# Patient Record
Sex: Male | Born: 1980 | Marital: Single | Smoking: Never smoker
Health system: Southern US, Community
[De-identification: ages and names within clinical notes are randomized; demographics above are authoritative.]

## PROBLEM LIST (undated history)

## (undated) DIAGNOSIS — R569 Unspecified convulsions: Secondary | ICD-10-CM

## (undated) DIAGNOSIS — B019 Varicella without complication: Secondary | ICD-10-CM

## (undated) DIAGNOSIS — Z5189 Encounter for other specified aftercare: Secondary | ICD-10-CM

## (undated) HISTORY — PX: OTHER SURGICAL HISTORY: SHX169

## (undated) HISTORY — DX: Unspecified convulsions: R56.9

## (undated) HISTORY — DX: Varicella without complication: B01.9

## (undated) HISTORY — DX: Encounter for other specified aftercare: Z51.89

---

## 2014-10-18 ENCOUNTER — Telehealth: Payer: Self-pay | Admitting: Family

## 2014-10-18 ENCOUNTER — Other Ambulatory Visit (INDEPENDENT_AMBULATORY_CARE_PROVIDER_SITE_OTHER): Payer: BC Managed Care – PPO

## 2014-10-18 ENCOUNTER — Ambulatory Visit (INDEPENDENT_AMBULATORY_CARE_PROVIDER_SITE_OTHER): Payer: BC Managed Care – PPO | Admitting: Family

## 2014-10-18 ENCOUNTER — Encounter: Payer: Self-pay | Admitting: Family

## 2014-10-18 VITALS — HR 82 | Temp 98.2°F | Resp 18 | Ht 70.0 in | Wt 196.4 lb

## 2014-10-18 DIAGNOSIS — Z0000 Encounter for general adult medical examination without abnormal findings: Secondary | ICD-10-CM

## 2014-10-18 DIAGNOSIS — L505 Cholinergic urticaria: Secondary | ICD-10-CM

## 2014-10-18 DIAGNOSIS — Z0189 Encounter for other specified special examinations: Secondary | ICD-10-CM

## 2014-10-18 DIAGNOSIS — Q255 Atresia of pulmonary artery: Secondary | ICD-10-CM

## 2014-10-18 LAB — COMPREHENSIVE METABOLIC PANEL
ALK PHOS: 51 U/L (ref 39–117)
ALT: 18 U/L (ref 0–53)
AST: 14 U/L (ref 0–37)
Albumin: 4.3 g/dL (ref 3.5–5.2)
BUN: 15 mg/dL (ref 6–23)
CHLORIDE: 104 meq/L (ref 96–112)
CO2: 30 mEq/L (ref 19–32)
Calcium: 9.6 mg/dL (ref 8.4–10.5)
Creatinine, Ser: 0.97 mg/dL (ref 0.40–1.50)
GFR: 94.65 mL/min (ref >60.00)
GLUCOSE: 90 mg/dL (ref 70–99)
POTASSIUM: 4.3 meq/L (ref 3.5–5.1)
SODIUM: 141 meq/L (ref 135–145)
Total Bilirubin: 0.6 mg/dL (ref 0.2–1.2)
Total Protein: 7.3 g/dL (ref 6.0–8.3)

## 2014-10-18 LAB — CBC WITH DIFFERENTIAL/PLATELET
Basophils Absolute: 0 10*3/uL (ref 0.0–0.1)
Basophils Relative: 0.3 % (ref 0.0–3.0)
EOS PCT: 1.5 % (ref 0.0–5.0)
Eosinophils Absolute: 0.1 10*3/uL (ref 0.0–0.7)
HCT: 47.4 % (ref 39.0–52.0)
Hemoglobin: 16.1 g/dL (ref 13.0–17.0)
LYMPHS ABS: 1.4 10*3/uL (ref 0.7–4.0)
Lymphocytes Relative: 20.2 % (ref 12.0–46.0)
MCHC: 33.9 g/dL (ref 30.0–36.0)
MCV: 84.8 fl (ref 78.0–100.0)
MONO ABS: 0.6 10*3/uL (ref 0.1–1.0)
MONOS PCT: 8.7 % (ref 3.0–12.0)
NEUTROS ABS: 4.9 10*3/uL (ref 1.4–7.7)
NEUTROS PCT: 69.3 % (ref 43.0–77.0)
PLATELETS: 231 10*3/uL (ref 150.0–400.0)
RBC: 5.59 Mil/uL (ref 4.22–5.81)
RDW: 13.2 % (ref 11.5–15.5)
WBC: 7.1 10*3/uL (ref 4.0–10.5)

## 2014-10-18 LAB — LIPID PANEL
CHOLESTEROL: 170 mg/dL (ref 0–200)
HDL: 41 mg/dL (ref >39.00)
LDL CALC: 107 mg/dL — AB (ref 0–99)
NONHDL: 129.06
Total CHOL/HDL Ratio: 4
Triglycerides: 108 mg/dL (ref 0.0–149.0)
VLDL: 21.6 mg/dL (ref 0.0–40.0)

## 2014-10-18 LAB — TSH: TSH: 0.98 u[IU]/mL (ref 0.35–4.50)

## 2014-10-18 MED ORDER — LORATADINE 10 MG PO TABS: 10.0000 mg | ORAL_TABLET | Freq: Every day | ORAL | Status: AC

## 2014-10-18 NOTE — Telephone Encounter (Signed)
Patient states that he would like a referral to a cardiologist and an allergist.

## 2014-10-18 NOTE — Telephone Encounter (Signed)
Referrals placed 

## 2014-10-18 NOTE — Assessment & Plan Note (Signed)
Not currently experiencing symptoms. Symptoms consistent with cholinergic urticaria. Start loratadine. Follow up if symptoms worsen or fail to improve with medication and possible referral to dermatology.

## 2014-10-18 NOTE — Telephone Encounter (Signed)
Please inform patient that his blood work is normal with no need for any changes or any cause of his symptoms.

## 2014-10-18 NOTE — Assessment & Plan Note (Signed)
Obtain TSH, lipid profile tablets, comprehensive metabolic panel, and CBC with differential for routine blood work.

## 2014-10-18 NOTE — Progress Notes (Signed)
Subjective:    Patient ID: John Valentine, male    DOB: 1980/01/29, 34 y.o.   MRN: [id number]  Chief Complaint  Patient presents with  . Establish Care    when his body heat hits a certain spike like when he walks outside and gets in the sun, it feels like a bunch of fire ants are crawling all over him, last from 5-25 mins at a time    HPI:  John FredricksonGabriel L Drewry is a 34 y.o. male who  has a past medical history of Blood transfusion without reported diagnosis; Seizures (HCC); and Chicken pox. and presents today for an office visit to establish.    1.) Heat rash - Associated symptom of feeling of burning and itching located sporadically around his trunk and described as fire ants crawling all over him and itching lasting about 5-25 minutes has been going on for several years and started when he moved to [place]. Modifying factors include stress adjustment and changing detergents. Aggravating factors include spike in body temperatures. Frequency ranges from several times per day or up to a couple of times per week. Denies any rash.   2.) Routine blood work - request routine blood work to check for abnormalities.   No Known Allergies   No outpatient prescriptions prior to visit.   No facility-administered medications prior to visit.     Past Medical History  Diagnosis Date  . Blood transfusion without reported diagnosis   . Seizures (HCC)   . Chicken pox      Past Surgical History  Procedure Laterality Date  . Open heart surgery      Pulmonary Atresia     Family History  Problem Relation Age of Onset  . Healthy Mother   . Healthy Father   . Healthy Maternal Grandmother   . Hepatitis Maternal Grandfather   . Healthy Paternal Grandmother   . Healthy Paternal Grandfather      Social History   Social History  . Marital Status: Single    Spouse Name: N/A  . Number of Children: 0  . Years of Education: 18   Occupational History  . Teacher     Social  History Main Topics  . Smoking status: Never Smoker   . Smokeless tobacco: Never Used  . Alcohol Use: 0.6 oz/week    0 Standard drinks or equivalent, 1 Glasses of wine per week  . Drug Use: No  . Sexual Activity: Not on file   Other Topics Concern  . Not on file   Social History Narrative   Fun: watch films, write, play video games            Cardiac surgeon:   Dr. Mateo FlowJoolher, [address], [place], [place] [id number]      Review of Systems  Constitutional: Negative for fever and chills.  Respiratory: Negative for chest tightness and shortness of breath.   Cardiovascular: Negative for chest pain, palpitations and leg swelling.  Endocrine: Negative for cold intolerance and heat intolerance.  Skin: Negative for rash and wound.      Objective:    Pulse 82  Temp(Src) 98.2 F (36.8 C) (Oral)  Resp 18  Ht 5\' 10"  (1.778 m)  Wt 196 lb 6.4 oz (89.086 kg)  BMI 28.18 kg/m2  SpO2 98% Nursing note and vital signs reviewed.  Physical Exam  Constitutional: He is oriented to person, place, and time. He appears well-developed and well-nourished. No distress.  Cardiovascular: Normal rate, regular rhythm, normal  heart sounds and intact distal pulses.   Pulmonary/Chest: Effort normal and breath sounds normal.  Neurological: He is alert and oriented to person, place, and time.  Skin: Skin is warm and dry. No rash noted.  Psychiatric: He has a normal mood and affect. His behavior is normal. Judgment and thought content normal.       Assessment & Plan:   Problem List Items Addressed This Visit      Musculoskeletal and Integument   Cholinergic urticaria - Primary    Not currently experiencing symptoms. Symptoms consistent with cholinergic urticaria. Start loratadine. Follow up if symptoms worsen or fail to improve with medication and possible referral to dermatology.      Relevant Medications   loratadine (CLARITIN) 10 MG tablet     Other   Encounter for routine laboratory  testing    Obtain TSH, lipid profile tablets, comprehensive metabolic panel, and CBC with differential for routine blood work.      Relevant Orders   Comprehensive metabolic panel (Completed)   Lipid panel (Completed)   TSH (Completed)   CBC w/Diff (Completed)

## 2014-10-18 NOTE — Progress Notes (Signed)
Pre visit review using our clinic review tool, if applicable. No additional management support is needed unless otherwise documented below in the visit note. 

## 2014-10-18 NOTE — Patient Instructions (Signed)
Thank you for choosing [facility].  Summary/Instructions:  Your prescription(s) have been submitted to your pharmacy or been printed and provided for you. Please take as directed and contact our office if you believe you are having problem(s) with the medication(s) or have any questions.  If your symptoms worsen or fail to improve, please contact our office for further instruction, or in case of emergency go directly to the emergency room at the closest medical facility.   Cholinergic urticara

## 2014-10-20 NOTE — Telephone Encounter (Signed)
LVM letting know.

## 2014-11-29 ENCOUNTER — Encounter: Payer: Self-pay | Admitting: Family

## 2015-05-24 ENCOUNTER — Other Ambulatory Visit: Payer: Self-pay | Admitting: Family Medicine

## 2015-05-24 ENCOUNTER — Ambulatory Visit
Admission: RE | Admit: 2015-05-24 | Discharge: 2015-05-24 | Disposition: A | Discharge status: Home / Self Care | Payer: Worker's Compensation | Source: Ambulatory Visit | Attending: Family Medicine | Admitting: Family Medicine

## 2015-05-24 DIAGNOSIS — J3489 Other specified disorders of nose and nasal sinuses: Secondary | ICD-10-CM | POA: Insufficient documentation

## 2015-05-24 DIAGNOSIS — Z026 Encounter for examination for insurance purposes: Secondary | ICD-10-CM | POA: Diagnosis not present

## 2015-05-24 DIAGNOSIS — S0993XA Unspecified injury of face, initial encounter: Secondary | ICD-10-CM | POA: Diagnosis present

## 2015-05-24 DIAGNOSIS — H578 Other specified disorders of eye and adnexa: Secondary | ICD-10-CM | POA: Insufficient documentation

## 2015-11-23 ENCOUNTER — Ambulatory Visit (INDEPENDENT_AMBULATORY_CARE_PROVIDER_SITE_OTHER): Payer: 59 | Admitting: Family Medicine

## 2015-11-23 ENCOUNTER — Encounter: Payer: Self-pay | Admitting: Family Medicine

## 2015-11-23 VITALS — BP 90/68 | HR 98 | Temp 98.3°F | Resp 16 | Ht 70.0 in | Wt 174.6 lb

## 2015-11-23 DIAGNOSIS — Z113 Encounter for screening for infections with a predominantly sexual mode of transmission: Secondary | ICD-10-CM

## 2015-11-23 DIAGNOSIS — Z0000 Encounter for general adult medical examination without abnormal findings: Secondary | ICD-10-CM

## 2015-11-23 DIAGNOSIS — Q220 Pulmonary valve atresia: Secondary | ICD-10-CM | POA: Insufficient documentation

## 2015-11-23 LAB — CBC
HCT: 47.9 % (ref 38.5–50.0)
Hemoglobin: 16.3 g/dL (ref 13.2–17.1)
MCH: 28.8 pg (ref 27.0–33.0)
MCHC: 34 g/dL (ref 32.0–36.0)
MCV: 84.8 fL (ref 80.0–100.0)
MPV: 10.8 fL (ref 7.5–12.5)
PLATELETS: 171 10*3/uL (ref 140–400)
RBC: 5.65 MIL/uL (ref 4.20–5.80)
RDW: 13.4 % (ref 11.0–15.0)
WBC: 5.6 10*3/uL (ref 3.8–10.8)

## 2015-11-23 LAB — BASIC METABOLIC PANEL
BUN: 16 mg/dL (ref 7–25)
CO2: 27 mmol/L (ref 20–31)
Calcium: 9.3 mg/dL (ref 8.6–10.3)
Chloride: 103 mmol/L (ref 98–110)
Creat: 0.92 mg/dL (ref 0.60–1.35)
Glucose, Bld: 83 mg/dL (ref 65–99)
POTASSIUM: 4.1 mmol/L (ref 3.5–5.3)
SODIUM: 138 mmol/L (ref 135–146)

## 2015-11-23 LAB — LIPID PANEL
CHOL/HDL RATIO: 4.6 ratio (ref <5.0)
Cholesterol: 204 mg/dL — ABNORMAL HIGH (ref <200)
HDL: 44 mg/dL (ref >40)
LDL Cholesterol: 137 mg/dL — ABNORMAL HIGH (ref <100)
Triglycerides: 116 mg/dL (ref <150)
VLDL: 23 mg/dL (ref <30)

## 2015-11-23 LAB — HIV ANTIBODY (ROUTINE TESTING W REFLEX): HIV: NONREACTIVE

## 2015-11-23 LAB — HEPATITIS B SURFACE ANTIBODY, QUANTITATIVE

## 2015-11-23 NOTE — Patient Instructions (Addendum)
   IF you received an x-ray today, you will receive an invoice from [facility]. Please contact [facility] at [phone] with questions or concerns regarding your invoice.   IF you received labwork today, you will receive an invoice from [facility]. Please contact [facility] at [phone] with questions or concerns regarding your invoice.   Our billing staff will not be able to assist you with questions regarding bills from these companies.  You will be contacted with the lab results as soon as they are available. The fastest way to get your results is to activate your My Chart account. Instructions are located on the last page of this paperwork. If you have not heard from us regarding the results in 2 weeks, please contact this office.     Health Maintenance, Male A healthy lifestyle and preventative care can promote health and wellness.  Maintain regular health, dental, and eye exams.  Eat a healthy diet. Foods like vegetables, fruits, whole grains, low-fat dairy products, and lean protein foods contain the nutrients you need and are low in calories. Decrease your intake of foods high in solid fats, added sugars, and salt. Get information about a proper diet from your health care provider, if necessary.  Regular physical exercise is one of the most important things you can do for your health. Most adults should get at least 150 minutes of moderate-intensity exercise (any activity that increases your heart rate and causes you to sweat) each week. In addition, most adults need muscle-strengthening exercises on 2 or more days a week.   Maintain a healthy weight. The body mass index (BMI) is a screening tool to identify possible weight problems. It provides an estimate of body fat based on height and weight. Your health care provider can find your BMI and can help you achieve or maintain a healthy weight. For males 20 years and older:  A BMI  below 18.5 is considered underweight.  A BMI of 18.5 to 24.9 is normal.  A BMI of 25 to 29.9 is considered overweight.  A BMI of 30 and above is considered obese.  Maintain normal blood lipids and cholesterol by exercising and minimizing your intake of saturated fat. Eat a balanced diet with plenty of fruits and vegetables. Blood tests for lipids and cholesterol should begin at age 20 and be repeated every 5 years. If your lipid or cholesterol levels are high, you are over age 50, or you are at high risk for heart disease, you may need your cholesterol levels checked more frequently.Ongoing high lipid and cholesterol levels should be treated with medicines if diet and exercise are not working.  If you smoke, find out from your health care provider how to quit. If you do not use tobacco, do not start.  Lung cancer screening is recommended for adults aged 55-80 years who are at high risk for developing lung cancer because of a history of smoking. A yearly low-dose CT scan of the lungs is recommended for people who have at least a 30-pack-year history of smoking and are current smokers or have quit within the past 15 years. A pack year of smoking is smoking an average of 1 pack of cigarettes a day for 1 year (for example, a 30-pack-year history of smoking could mean smoking 1 pack a day for 30 years or 2 packs a day for 15 years). Yearly screening should continue until the smoker has stopped smoking for at least 15 years. Yearly screening should be   stopped for people who develop a health problem that would prevent them from having lung cancer treatment.  If you choose to drink alcohol, do not have more than 2 drinks per day. One drink is considered to be 12 oz (360 mL) of beer, 5 oz (150 mL) of wine, or 1.5 oz (45 mL) of liquor.  Avoid the use of street drugs. Do not share needles with anyone. Ask for help if you need support or instructions about stopping the use of drugs.  High blood pressure  causes heart disease and increases the risk of stroke. High blood pressure is more likely to develop in:  People who have blood pressure in the end of the normal range (100-139/85-89 mm Hg).  People who are overweight or obese.  People who are African American.  If you are 18-39 years of age, have your blood pressure checked every 3-5 years. If you are 40 years of age or older, have your blood pressure checked every year. You should have your blood pressure measured twice-once when you are at a hospital or clinic, and once when you are not at a hospital or clinic. Record the average of the two measurements. To check your blood pressure when you are not at a hospital or clinic, you can use:  An automated blood pressure machine at a pharmacy.  A home blood pressure monitor.  If you are 45-79 years old, ask your health care provider if you should take aspirin to prevent heart disease.  Diabetes screening involves taking a blood sample to check your fasting blood sugar level. This should be done once every 3 years after age 45 if you are at a normal weight and without risk factors for diabetes. Testing should be considered at a younger age or be carried out more frequently if you are overweight and have at least 1 risk factor for diabetes.  Colorectal cancer can be detected and often prevented. Most routine colorectal cancer screening begins at the age of 50 and continues through age 75. However, your health care provider may recommend screening at an earlier age if you have risk factors for colon cancer. On a yearly basis, your health care provider may provide home test kits to check for hidden blood in the stool. A small camera at the end of a tube may be used to directly examine the colon (sigmoidoscopy or colonoscopy) to detect the earliest forms of colorectal cancer. Talk to your health care provider about this at age 50 when routine screening begins. A direct exam of the colon should be repeated  every 5-10 years through age 75, unless early forms of precancerous polyps or small growths are found.  People who are at an increased risk for hepatitis B should be screened for this virus. You are considered at high risk for hepatitis B if:  You were born in a country where hepatitis B occurs often. Talk with your health care provider about which countries are considered high risk.  Your parents were born in a high-risk country and you have not received a shot to protect against hepatitis B (hepatitis B vaccine).  You have HIV or AIDS.  You use needles to inject street drugs.  You live with, or have sex with, someone who has hepatitis B.  You are a man who has sex with other men (MSM).  You get hemodialysis treatment.  You take certain medicines for conditions like cancer, organ transplantation, and autoimmune conditions.  Hepatitis C blood testing is recommended   for all people born from 1945 through 1965 and any individual with known risk factors for hepatitis C.  Healthy men should no longer receive prostate-specific antigen (PSA) blood tests as part of routine cancer screening. Talk to your health care provider about prostate cancer screening.  Testicular cancer screening is not recommended for adolescents or adult males who have no symptoms. Screening includes self-exam, a health care provider exam, and other screening tests. Consult with your health care provider about any symptoms you have or any concerns you have about testicular cancer.  Practice safe sex. Use condoms and avoid high-risk sexual practices to reduce the spread of sexually transmitted infections (STIs).  You should be screened for STIs, including gonorrhea and chlamydia if:  You are sexually active and are younger than 24 years.  You are older than 24 years, and your health care provider tells you that you are at risk for this type of infection.  Your sexual activity has changed since you were last screened,  and you are at an increased risk for chlamydia or gonorrhea. Ask your health care provider if you are at risk.  If you are at risk of being infected with HIV, it is recommended that you take a prescription medicine daily to prevent HIV infection. This is called pre-exposure prophylaxis (PrEP). You are considered at risk if:  You are a man who has sex with other men (MSM).  You are a heterosexual man who is sexually active with multiple partners.  You take drugs by injection.  You are sexually active with a partner who has HIV.  Talk with your health care provider about whether you are at high risk of being infected with HIV. If you choose to begin PrEP, you should first be tested for HIV. You should then be tested every 3 months for as long as you are taking PrEP.  Use sunscreen. Apply sunscreen liberally and repeatedly throughout the day. You should seek shade when your shadow is shorter than you. Protect yourself by wearing long sleeves, pants, a wide-brimmed hat, and sunglasses year round whenever you are outdoors.  Tell your health care provider of new moles or changes in moles, especially if there is a change in shape or color. Also, tell your health care provider if a mole is larger than the size of a pencil eraser.  A one-time screening for abdominal aortic aneurysm (AAA) and surgical repair of large AAAs by ultrasound is recommended for men aged 65-75 years who are current or former smokers.  Stay current with your vaccines (immunizations). This information is not intended to replace advice given to you by your health care provider. Make sure you discuss any questions you have with your health care provider. Document Released: 06/16/2007 Document Revised: 01/08/2014 Document Reviewed: 09/21/2014 [employer]vier Interactive Patient Education  2017 [employer]  

## 2015-11-23 NOTE — Progress Notes (Signed)
Chief Complaint  Patient presents with  . Annual Exam    Subjective:  John Valentine is a 35 y.o. male here for a health maintenance visit.  Patient is established pt  Patient Active Problem List   Diagnosis Date Noted  . 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 Encounter for routine laboratory testing 10/18/2014    Past Medical History:  Diagnosis Date  . Blood transfusion without reported diagnosis   . Chicken pox   . Seizures (HCC)     Past Surgical History:  Procedure Laterality Date  . open heart surgery     Pulmonary Atresia     Outpatient Medications Prior to Visit  Medication Sig Dispense Refill  . loratadine (CLARITIN) 10 MG tablet Take 1 tablet (10 mg total) by mouth daily. (Patient not taking: Reported on 11/23/2015) 90 tablet 1   No facility-administered medications prior to visit.     No Known Allergies   Family History  Problem Relation Age of Onset  . Healthy Mother   . Healthy Father   . Healthy Maternal Grandmother   . Hepatitis Maternal Grandfather   . Healthy Paternal Grandmother   . Healthy Paternal Grandfather      Health Habits: Dental Exam: up to date Eye Exam: up to date Exercise: 4 times/week on average Current exercise activities: walking/running Diet: balanced diet  Social History   Social History  . Marital status: Single    Spouse name: N/A  . Number of children: 0  . Years of education: 78   Occupational History  . Teacher     Social History Main Topics  . Smoking status: Never Smoker  . Smokeless tobacco: Never Used  . Alcohol use 0.6 oz/week    1 Glasses of wine per week  . Drug use: No  . Sexual activity: Not on file   Other Topics Concern  . Not on file   Social History Narrative   Fun: watch films, write, play video games            Cardiac surgeon:   Dr. Mateo Flow, [address], [place], [place] [address]   History  Alcohol Use  . 0.6 oz/week  . 1 Glasses of  wine per week   History  Smoking Status  . Never Smoker  Smokeless Tobacco  . Never Used   History  Drug Use No    GYN: Sexual Health Sexually active:  with male partner   Health Maintenance: See under health Maintenance activity for review of completion dates as well. Immunization History  Administered Date(s) Administered  . Influenza-Unspecified 10/02/2015      Depression Screen-PHQ2/9 Depression screen PHQ 2/9 11/23/2015  Decreased Interest 0  Down, Depressed, Hopeless 0  PHQ - 2 Score 0      Depression Severity and Treatment Recommendations:  0-4= None  5-9= Mild / Treatment: Support, educate to call if worse; return in one month  10-14= Moderate / Treatment: Support, watchful waiting; Antidepressant or Psycotherapy  15-19= Moderately severe / Treatment: Antidepressant OR Psychotherapy  >= 20 = Major depression, severe / Antidepressant AND Psychotherapy    Review of Systems   Review of Systems  Constitutional: Negative for chills and fever.  HENT: Negative for hearing loss and tinnitus.   Eyes: Negative for blurred vision and double vision.  Respiratory: Negative for cough and hemoptysis.   Cardiovascular: Negative for chest pain, palpitations and orthopnea.  Gastrointestinal: Negative for abdominal pain, diarrhea, nausea and vomiting.  Genitourinary: Negative for dysuria, frequency and urgency.  Musculoskeletal: Negative for back pain and neck pain.  Skin: Negative for itching and rash.  Neurological: Negative for dizziness, tingling and headaches.  Psychiatric/Behavioral: Negative for depression. The patient does not have insomnia.     See HPI for ROS as well.    Objective:   Vitals:   11/23/15 0933  BP: 90/68  Pulse: 98  Resp: 16  Temp: 98.3 F (36.8 C)  TempSrc: Oral  SpO2: 97%  Weight: 174 lb 9.6 oz (79.2 kg)  Height: 5\' 10"  (1.778 m)    Body mass index is 25.05 kg/m.  Physical Exam  Constitutional: He is oriented to person,  place, and time. He appears well-developed and well-nourished.  HENT:  Head: Normocephalic and atraumatic.  Eyes: Conjunctivae and EOM are normal.  Neck: Normal range of motion. Neck supple. No tracheal deviation present. No thyromegaly present.  Cardiovascular: Normal rate, regular rhythm and normal heart sounds.   Pulmonary/Chest: Effort normal and breath sounds normal. No respiratory distress. He has no wheezes.  Abdominal: Soft. Bowel sounds are normal. He exhibits no distension. There is no tenderness. There is no rebound.  Musculoskeletal: Normal range of motion. He exhibits no edema.  Neurological: He is alert and oriented to person, place, and time. He has normal reflexes. No cranial nerve deficit.  Skin: Skin is warm. No erythema.  Psychiatric: He has a normal mood and affect. His behavior is normal. Judgment and thought content normal.       Assessment/Plan:   Patient was seen for a health maintenance exam.  Counseled the patient on health maintenance issues. Reviewed her health mainteance schedule and ordered appropriate tests (see orders.) Counseled on regular exercise and weight management. Recommend regular eye exams and dental cleaning.   The following issues were addressed today for health maintenance:   John Valentine was seen today for annual exam.  Diagnoses and all orders for this visit:  Health maintenance examination- will screen for anemia and dyslipidemia Discussed age appropriate screenings continue -     CBC -     Lipid panel -     Basic metabolic panel  Screen for STD (sexually transmitted disease)- discussed routine std screening -     HIV antibody -     RPR -     Hepatitis B surface antibody -     GC/Chlamydia Probe Amp    No Follow-up on file.    Body mass index is 25.05 kg/m.:  Discussed the patient's BMI with patient. The BMI body mass index is 25.05 kg/m.     No future appointments.  Patient Instructions       IF you received an  x-ray today, you will receive an invoice from [facility]. Please contact [facility] at [phone] with questions or concerns regarding your invoice.   IF you received labwork today, you will receive an invoice from [employer]. Please contact [facility] at [phone] with questions or concerns regarding your invoice.   Our billing staff will not be able to assist you with questions regarding bills from these companies.  You will be contacted with the lab results as soon as they are available. The fastest way to get your results is to activate your My Chart account. Instructions are located on the last page of this paperwork. If you have not heard from [place] regarding the results in 2 weeks, please contact this office.    Health Maintenance, Male A healthy lifestyle and preventative  care can promote health and wellness.  Maintain regular health, dental, and eye exams.  Eat a healthy diet. Foods like vegetables, fruits, whole grains, low-fat dairy products, and lean protein foods contain the nutrients you need and are low in calories. Decrease your intake of foods high in solid fats, added sugars, and salt. Get information about a proper diet from your health care provider, if necessary.  Regular physical exercise is one of the most important things you can do for your health. Most adults should get at least 150 minutes of moderate-intensity exercise (any activity that increases your heart rate and causes you to sweat) each week. In addition, most adults need muscle-strengthening exercises on 2 or more days a week.   Maintain a healthy weight. The body mass index (BMI) is a screening tool to identify possible weight problems. It provides an estimate of body fat based on height and weight. Your health care provider can find your BMI and can help you achieve or maintain a healthy weight. For males 20 years and older:  A BMI below 18.5 is considered  underweight.  A BMI of 18.5 to 24.9 is normal.  A BMI of 25 to 29.9 is considered overweight.  A BMI of 30 and above is considered obese.  Maintain normal blood lipids and cholesterol by exercising and minimizing your intake of saturated fat. Eat a balanced diet with plenty of fruits and vegetables. Blood tests for lipids and cholesterol should begin at age 40 and be repeated every 5 years. If your lipid or cholesterol levels are high, you are over age 18, or you are at high risk for heart disease, you may need your cholesterol levels checked more frequently.Ongoing high lipid and cholesterol levels should be treated with medicines if diet and exercise are not working.  If you smoke, find out from your health care provider how to quit. If you do not use tobacco, do not start.  Lung cancer screening is recommended for adults aged 55-80 years who are at high risk for developing lung cancer because of a history of smoking. A yearly low-dose CT scan of the lungs is recommended for people who have at least a 30-pack-year history of smoking and are current smokers or have quit within the past 15 years. A pack year of smoking is smoking an average of 1 pack of cigarettes a day for 1 year (for example, a 30-pack-year history of smoking could mean smoking 1 pack a day for 30 years or 2 packs a day for 15 years). Yearly screening should continue until the smoker has stopped smoking for at least 15 years. Yearly screening should be stopped for people who develop a health problem that would prevent them from having lung cancer treatment.  If you choose to drink alcohol, do not have more than 2 drinks per day. One drink is considered to be 12 oz (360 mL) of beer, 5 oz (150 mL) of wine, or 1.5 oz (45 mL) of liquor.  Avoid the use of street drugs. Do not share needles with anyone. Ask for help if you need support or instructions about stopping the use of drugs.  High blood pressure causes heart disease and  increases the risk of stroke. High blood pressure is more likely to develop in:  People who have blood pressure in the end of the normal range (100-139/85-89 mm Hg).  People who are overweight or obese.  People who are African American.  If you are 48-46 years of age,  have your blood pressure checked every 3-5 years. If you are 34 years of age or older, have your blood pressure checked every year. You should have your blood pressure measured twice-once when you are at a hospital or clinic, and once when you are not at a hospital or clinic. Record the average of the two measurements. To check your blood pressure when you are not at a hospital or clinic, you can use:  An automated blood pressure machine at a pharmacy.  A home blood pressure monitor.  If you are 2445-34 years old, ask your health care provider if you should take aspirin to prevent heart disease.  Diabetes screening involves taking a blood sample to check your fasting blood sugar level. This should be done once every 3 years after age 34 if you are at a normal weight and without risk factors for diabetes. Testing should be considered at a younger age or be carried out more frequently if you are overweight and have at least 1 risk factor for diabetes.  Colorectal cancer can be detected and often prevented. Most routine colorectal cancer screening begins at the age of 34 and continues through age 34. However, your health care provider may recommend screening at an earlier age if you have risk factors for colon cancer. On a yearly basis, your health care provider may provide home test kits to check for hidden blood in the stool. A small camera at the end of a tube may be used to directly examine the colon (sigmoidoscopy or colonoscopy) to detect the earliest forms of colorectal cancer. Talk to your health care provider about this at age 10150 when routine screening begins. A direct exam of the colon should be repeated every 5-10 years through  age 34, unless early forms of precancerous polyps or small growths are found.  People who are at an increased risk for hepatitis B should be screened for this virus. You are considered at high risk for hepatitis B if:  You were born in a country where hepatitis B occurs often. Talk with your health care provider about which countries are considered high risk.  Your parents were born in a high-risk country and you have not received a shot to protect against hepatitis B (hepatitis B vaccine).  You have HIV or AIDS.  You use needles to inject street drugs.  You live with, or have sex with, someone who has hepatitis B.  You are a man who has sex with other men (MSM).  You get hemodialysis treatment.  You take certain medicines for conditions like cancer, organ transplantation, and autoimmune conditions.  Hepatitis C blood testing is recommended for all people born from [id number] through 1964 and any individual with known risk factors for hepatitis C.  Healthy men should no longer receive prostate-specific antigen (PSA) blood tests as part of routine cancer screening. Talk to your health care provider about prostate cancer screening.  Testicular cancer screening is not recommended for adolescents or adult males who have no symptoms. Screening includes self-exam, a health care provider exam, and other screening tests. Consult with your health care provider about any symptoms you have or any concerns you have about testicular cancer.  Practice safe sex. Use condoms and avoid high-risk sexual practices to reduce the spread of sexually transmitted infections (STIs).  You should be screened for STIs, including gonorrhea and chlamydia if:  You are sexually active and are younger than 24 years.  You are older than 24 years, and your health  care provider tells you that you are at risk for this type of infection.  Your sexual activity has changed since you were last screened, and you are at an  increased risk for chlamydia or gonorrhea. Ask your health care provider if you are at risk.  If you are at risk of being infected with HIV, it is recommended that you take a prescription medicine daily to prevent HIV infection. This is called pre-exposure prophylaxis (PrEP). You are considered at risk if:  You are a man who has sex with other men (MSM).  You are a heterosexual man who is sexually active with multiple partners.  You take drugs by injection.  You are sexually active with a partner who has HIV.  Talk with your health care provider about whether you are at high risk of being infected with HIV. If you choose to begin PrEP, you should first be tested for HIV. You should then be tested every 3 months for as long as you are taking PrEP.  Use sunscreen. Apply sunscreen liberally and repeatedly throughout the day. You should seek shade when your shadow is shorter than you. Protect yourself by wearing long sleeves, pants, a wide-brimmed hat, and sunglasses year round whenever you are outdoors.  Tell your health care provider of new moles or changes in moles, especially if there is a change in shape or color. Also, tell your health care provider if a mole is larger than the size of a pencil eraser.  A one-time screening for abdominal aortic aneurysm (AAA) and surgical repair of large AAAs by ultrasound is recommended for men aged 65-75 years who are current or former smokers.  Stay current with your vaccines (immunizations). This information is not intended to replace advice given to you by your health care provider. Make sure you discuss any questions you have with your health care provider. Document Released: 06/16/2007 Document Revised: 01/08/2014 Document Reviewed: 09/21/2014 Elsevier Interactive Patient Education  2017 [employer]

## 2015-11-24 LAB — GC/CHLAMYDIA PROBE AMP
CT Probe RNA: NOT DETECTED
GC PROBE AMP APTIMA: NOT DETECTED

## 2015-11-24 LAB — RPR

## 2016-05-04 ENCOUNTER — Encounter: Payer: Self-pay | Admitting: Family Medicine

## 2016-05-04 ENCOUNTER — Ambulatory Visit (INDEPENDENT_AMBULATORY_CARE_PROVIDER_SITE_OTHER): Payer: 59 | Admitting: Family Medicine

## 2016-05-04 VITALS — BP 103/72 | HR 94 | Temp 98.6°F | Resp 16 | Ht 70.0 in | Wt 175.4 lb

## 2016-05-04 DIAGNOSIS — Q220 Pulmonary valve atresia: Secondary | ICD-10-CM

## 2016-05-04 DIAGNOSIS — R0789 Other chest pain: Secondary | ICD-10-CM | POA: Diagnosis not present

## 2016-05-04 DIAGNOSIS — R002 Palpitations: Secondary | ICD-10-CM | POA: Diagnosis not present

## 2016-05-04 NOTE — Progress Notes (Signed)
Chief Complaint  Patient presents with  . Chest Pain    pt has had chest pain x 2 weeks, states its worse in the AM   . Abdominal Pain    States he thinks this is strees related     HPI   Pt reports that he has been having chest pains, palpitations and shortness for a week Reports that work makes chest makes the chest pain and palpitations worse Reports it is worst in the morning or late at night He states that during the day he is very busy He reports that he often feels like he does not have enough time in the day to get his tasks done so he gets chest pains at night and then in the morning he things about all the things he has to do for the day and the pains come back.    Pulmonary Atresia Pt had a cardiologist that he saw routinely for his pulm atresia. He has not been seen since 5 years and his last echo was 10 years ago He reports that he currently has palpitations that he thinks is stress related He would like to establish with a new cardiologist  Past Medical History:  Diagnosis Date  . Blood transfusion without reported diagnosis   . Chicken pox   . Seizures (HCC)     Current Outpatient Prescriptions  Medication Sig Dispense Refill  . loratadine (CLARITIN) 10 MG tablet Take 1 tablet (10 mg total) by mouth daily. (Patient not taking: Reported on 11/23/2015) 90 tablet 1   No current facility-administered medications for this visit.     Allergies: No Known Allergies  Past Surgical History:  Procedure Laterality Date  . open heart surgery     Pulmonary Atresia    Social History   Social History  . Marital status: Single    Spouse name: N/A  . Number of children: 0  . Years of education: 32   Occupational History  . Teacher     Social History Main Topics  . Smoking status: Never Smoker  . Smokeless tobacco: Never Used  . Alcohol use 0.6 oz/week    1 Glasses of wine per week  . Drug use: No  . Sexual activity: Not Asked   Other Topics Concern  .  None   Social History Narrative   Fun: watch films, write, play video games            Cardiac surgeon:   Dr. Mateo Valentine, [address], [place], [place] [address]    ROS  See hpi  Objective: Vitals:   05/04/16 1542  BP: 103/72  Pulse: 94  Resp: 16  Temp: 98.6 F (37 C)  TempSrc: Oral  SpO2: 98%  Weight: 175 lb 6.4 oz (79.6 kg)  Height: 5\' 10"  (1.778 m)    Physical Exam  Constitutional: He is oriented to person, place, and time. He appears well-developed and well-nourished.  HENT:  Head: Normocephalic and atraumatic.  Eyes: Conjunctivae and EOM are normal.  Cardiovascular: Normal rate, regular rhythm and normal heart sounds.   Pulmonary/Chest: Effort normal and breath sounds normal. No respiratory distress. He has no wheezes.  Neurological: He is alert and oriented to person, place, and time.  Skin: Skin is warm. Capillary refill takes less than 2 seconds.  Psychiatric: He has a normal mood and affect. His behavior is normal. Judgment and thought content normal.   ECG- sinus rhythm, RBBB No st elevation or twi   Assessment and Plan  John Valentine was seen today for chest pain and abdominal pain.  Diagnoses and all orders for this visit:  Other chest pain- will have pt follow up with Cardiology Discussed stress testing -     EKG 12-Lead -     Ambulatory referral to Cardiology  Pulmonary atresia-  For continuity will refer to Cardiology -     Ambulatory referral to Cardiology  Palpitations- avoid caffeine, increase hydration -     Ambulatory referral to Cardiology     John Valentine

## 2016-05-04 NOTE — Patient Instructions (Addendum)
Exercise Stress Electrocardiogram An exercise stress electrocardiogram is a test that is done to evaluate the blood supply to your heart. This test may also be called exercise stress electrocardiography. The test is done while you are walking on a treadmill. The goal of this test is to raise your heart rate. This test is done to find areas of poor blood flow to the heart by determining the extent of coronary artery disease (CAD). CAD is defined as narrowing in one or more heart (coronary) arteries of more than 70%. If you have an abnormal test result, this may mean that you are not getting adequate blood flow to your heart during exercise. Additional testing may be needed to understand why your test was abnormal. Tell a health care provider about:  Any allergies you have.  All medicines you are taking, including vitamins, herbs, eye drops, creams, and over-the-counter medicines.  Any problems you or family members have had with anesthetic medicines.  Any blood disorders you have.  Any surgeries you have had.  Any medical conditions you have.  Possibility of pregnancy, if this applies. What are the risks? Generally, this is a safe procedure. However, as with any procedure, complications can occur. Possible complications can include:  Pain or pressure in the following areas:  Chest.  Jaw or neck.  Between your shoulder blades.  Radiating down your left arm.  Dizziness or light-headedness.  Shortness of breath.  Increased or irregular heartbeats.  Nausea or vomiting.  Heart attack (rare). What happens before the procedure?  Avoid all forms of caffeine 24 hours before your test or as directed by your health care provider. This includes coffee, tea (even decaffeinated tea), caffeinated sodas, chocolate, cocoa, and certain pain medicines.  Follow your health care provider's instructions regarding eating and drinking before the test.  Take your medicines as directed at regular  times with water unless instructed otherwise. Exceptions may include:  If you have diabetes, ask how you are to take your insulin or pills. It is common to adjust insulin dosing the morning of the test.  If you are taking beta-blocker medicines, it is important to talk to your health care provider about these medicines well before the date of your test. Taking beta-blocker medicines may interfere with the test. In some cases, these medicines need to be changed or stopped 24 hours or more before the test.  If you wear a nitroglycerin patch, it may need to be removed prior to the test. Ask your health care provider if the patch should be removed before the test.  If you use an inhaler for any breathing condition, bring it with you to the test.  If you are an outpatient, bring a snack so you can eat right after the stress phase of the test.  Do not smoke for 4 hours prior to the test or as directed by your health care provider.  Do not apply lotions, powders, creams, or oils on your chest prior to the test.  Wear loose-fitting clothes and comfortable shoes for the test. This test involves walking on a treadmill. What happens during the procedure?  Multiple patches (electrodes) will be put on your chest. If needed, small areas of your chest may have to be shaved to get better contact with the electrodes. Once the electrodes are attached to your body, multiple wires will be attached to the electrodes and your heart rate will be monitored.  Your heart will be monitored both at rest and while exercising.  You  will walk on a treadmill. The treadmill will be started at a slow pace. The treadmill speed and incline will gradually be increased to raise your heart rate. What happens after the procedure?  Your heart rate and blood pressure will be monitored after the test.  You may return to your normal schedule including diet, activities, and medicines, unless your health care provider tells you  otherwise. This information is not intended to replace advice given to you by your health care provider. Make sure you discuss any questions you have with your health care provider. Document Released: 12/16/1999 Document Revised: 05/26/2015 Document Reviewed: 08/25/2012 Elsevier Interactive Patient Education  2017 [employer]

## 2016-07-03 ENCOUNTER — Telehealth: Payer: Self-pay | Admitting: Family Medicine

## 2016-07-03 NOTE — Telephone Encounter (Signed)
Pt drop off paperwork to be completed from his last CPE. Put paperwork in nurse box at 102.

## 2016-07-11 NOTE — Telephone Encounter (Signed)
Tried contacting pt via phone to advise Health Examination Certificate is ready for pickup.  No answer left message on voicemail to return office ([place]) call. dpg

## 2016-09-05 ENCOUNTER — Ambulatory Visit (INDEPENDENT_AMBULATORY_CARE_PROVIDER_SITE_OTHER): Payer: BC Managed Care – PPO | Admitting: Emergency Medicine

## 2016-09-05 ENCOUNTER — Encounter: Payer: Self-pay | Admitting: Emergency Medicine

## 2016-09-05 VITALS — BP 101/68 | HR 91 | Temp 98.3°F | Resp 16 | Ht 68.5 in | Wt 181.2 lb

## 2016-09-05 DIAGNOSIS — Z23 Encounter for immunization: Secondary | ICD-10-CM | POA: Diagnosis not present

## 2016-09-05 DIAGNOSIS — H6123 Impacted cerumen, bilateral: Secondary | ICD-10-CM

## 2016-09-05 MED ORDER — EAR WAX CLEANSING 6.5 % OT KIT: PACK | OTIC | 1 refills | Status: AC

## 2016-09-05 NOTE — Progress Notes (Signed)
John Valentine 36 y.o.   Chief Complaint  Patient presents with  . ear clogged    both - per patient x 2 days    HISTORY OF PRESENT ILLNESS: This is a 36 y.o. male complaining of both ears clogged x 2-3 days; has h/o same in the past.  Ear Fullness   There is pain in both ears. This is a new problem. The current episode started in the past 7 days. The problem occurs constantly. The problem has been gradually worsening. There has been no fever. The patient is experiencing no pain. Associated symptoms include hearing loss. Pertinent negatives include no abdominal pain, coughing, diarrhea, ear discharge, headaches, rash, sore throat or vomiting. He has tried nothing for the symptoms.     Prior to Admission medications   Medication Sig Start Date End Date Taking? Authorizing Provider  loratadine (CLARITIN) 10 MG tablet Take 1 tablet (10 mg total) by mouth daily. Patient not taking: Reported on 11/23/2015 10/18/14   Golden Circle, FNP    No Known Allergies  Patient Active Problem List   Diagnosis Date Noted  . 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 Encounter for routine laboratory testing 10/18/2014    Past Medical History:  Diagnosis Date  . Blood transfusion without reported diagnosis   . Chicken pox   . Seizures (Sisters)     Past Surgical History:  Procedure Laterality Date  . open heart surgery     Pulmonary Atresia    Social History   Social History  . Marital status: Single    Spouse name: N/A  . Number of children: 0  . Years of education: 35   Occupational History  . Teacher     Social History Main Topics  . Smoking status: Never Smoker  . Smokeless tobacco: Never Used  . Alcohol use 0.6 oz/week    1 Glasses of wine per week  . Drug use: No  . Sexual activity: Not on file   Other Topics Concern  . Not on file   Social History Narrative   Fun: watch films, write, play video games            Cardiac surgeon:     Dr. Micael Hampshire, [place], [place] [address]    Family History  Problem Relation Age of Onset  . Healthy Mother   . Healthy Father   . Healthy Maternal Grandmother   . Hepatitis Maternal Grandfather   . Healthy Paternal Grandmother   . Healthy Paternal Grandfather      Review of Systems  Constitutional: Negative.  Negative for chills and fever.  HENT: Positive for hearing loss. Negative for ear discharge, ear pain, nosebleeds and sore throat.   Eyes: Negative for discharge and redness.  Respiratory: Negative.  Negative for cough and shortness of breath.   Cardiovascular: Negative.  Negative for chest pain and palpitations.  Gastrointestinal: Negative for abdominal pain, diarrhea, nausea and vomiting.  Genitourinary: Negative for dysuria and hematuria.  Skin: Negative.  Negative for rash.  Neurological: Negative for dizziness, sensory change, focal weakness and headaches.  Endo/Heme/Allergies: Negative.   All other systems reviewed and are negative.  Vitals:   09/05/16 0808  BP: 101/68  Pulse: 91  Resp: 16  Temp: 98.3 F (36.8 C)  SpO2: 95%     Physical Exam  Constitutional: He is oriented to person, place, and time. He appears well-developed and well-nourished.  HENT:  Head: Normocephalic and atraumatic.  Bilateral wax excess; unable to see TM.  Eyes: Pupils are equal, round, and reactive to light. EOM are normal.  Neck: Normal range of motion. Neck supple.  Cardiovascular: Normal rate.   Pulmonary/Chest: Effort normal.  Musculoskeletal: Normal range of motion.  Neurological: He is alert and oriented to person, place, and time. No sensory deficit. He exhibits normal muscle tone.  Skin: Skin is warm and dry. Capillary refill takes less than 2 seconds. No rash noted.  Psychiatric: He has a normal mood and affect. His behavior is normal.  Vitals reviewed.  Unable to fully clear cerumen out of both ears after irrigation and manual attempts; instructed  to continue using eardrops and irrigate at home with irrigation kit.  ASSESSMENT & PLAN: John Valentine was seen today for ear clogged.  Diagnoses and all orders for this visit:  Impacted cerumen of both ears  Need for prophylactic vaccination and inoculation against influenza -     Flu Vaccine QUAD 36+ mos IM  Excessive ear wax, bilateral -     Ear wax removal -     Ear Lavage  Other orders -     Carbamide Peroxide-Saline (EAR WAX CLEANSING) 6.5 % KIT; Sig as indicated    Patient Instructions       IF you received an x-ray today, you will receive an invoice from [facility]. Please contact [facility] at [phone] with questions or concerns regarding your invoice.   IF you received labwork today, you will receive an invoice from [facility]. Please contact [facility] at [phone] with questions or concerns regarding your invoice.   Our billing staff will not be able to assist you with questions regarding bills from these companies.  You will be contacted with the lab results as soon as they are available. The fastest way to get your results is to activate your My Chart account. Instructions are located on the last page of this paperwork. If you have not heard from [place] regarding the results in 2 weeks, please contact this office.     Ear Irrigation What is ear irrigation? Ear irrigation is a procedure to wash dirt and wax out of your ear canal. This procedure is also called lavage. You may need ear irrigation if you are having trouble hearing because of a buildup of earwax. You may also have ear irrigation as part of the treatment for an ear infection. Getting wax and dirt out of your ear canal can help some medicines given as ear drops work better. How is ear irrigation performed? The procedure may vary among health care providers and hospitals. You may be given ear drops to put in your ear 15-20 minutes before irrigation. This helps loosen the wax. Then, a  syringe containing water and a sterile salt solution (saline) can be gently inserted into the ear canal. The saline is used to flush out wax and other debris. Ear irrigation kits are also available to use at home. Ask your health care provider if this is an option for you. Use a home irrigation kit only as told by your health care provider. Read the package instructions carefully. Follow the directions for using the syringe. Use water that is room temperature. Do not do ear irrigation at home if you:  Have diabetes. Diabetes increases the risk of infection.  Have a hole or tear in your eardrum.  Have tubes in your ears.  What are the risks of ear irrigation? Generally, this is a safe procedure. However, problems may occur, including:  Infection.  Pain.  Hearing loss.  Pushing water and debris into the eardrum. This can occur if there are holes in the eardrum.  Ear irrigation failing to work.  How should I care for my ears after having them irrigated? Cleaning  Clean the outside of your ear with a soft washcloth daily.  If told by your health care provider, use a few drops of baby oil, mineral oil, glycerin, hydrogen peroxide, or over-the-counter earwax softening drops.  Do not use cotton swabs to clean your ears. These can push wax down into the ear canal.  Do not put anything into your ears to try to remove wax. This includes ear candles. General Instructions  Take over-the-counter and prescription medicines only as told by your health care provider.  If you were prescribed an antibiotic medicine, use it as told by your health care provider. Do not stop using the antibiotic even if your condition improves.  Keep all follow-up visits as told by your health care provider. This is important.  Visit your health care provider at least once a year to have your ears and hearing checked. When should I seek medical care? Seek medical care if:  Your hearing is not improving or is  getting worse.  You have pain or redness in your ear.  You have fluid, blood, or pus coming out of your ear.  This information is not intended to replace advice given to you by your health care provider. Make sure you discuss any questions you have with your health care provider. Document Released: 01/14/2015 Document Revised: 11/14/2015 Document Reviewed: 05/27/2014 Elsevier Interactive Patient Education  2017 Elsevier Inc.      Agustina Caroli, MD [facility]

## 2016-09-05 NOTE — Patient Instructions (Addendum)
IF you received an x-ray today, you will receive an invoice from [facility] Radiology. Please contact [facility] Radiology at [phone] with questions or concerns regarding your invoice.   IF you received labwork today, you will receive an invoice from [facility]. Please contact [facility] at [phone] with questions or concerns regarding your invoice.   Our billing staff will not be able to assist you with questions regarding bills from these companies.  You will be contacted with the lab results as soon as they are available. The fastest way to get your results is to activate your My Chart account. Instructions are located on the last page of this paperwork. If you have not heard from [place] regarding the results in 2 weeks, please contact this office.     Ear Irrigation What is ear irrigation? Ear irrigation is a procedure to wash dirt and wax out of your ear canal. This procedure is also called lavage. You may need ear irrigation if you are having trouble hearing because of a buildup of earwax. You may also have ear irrigation as part of the treatment for an ear infection. Getting wax and dirt out of your ear canal can help some medicines given as ear drops work better. How is ear irrigation performed? The procedure may vary among health care providers and hospitals. You may be given ear drops to put in your ear 15-20 minutes before irrigation. This helps loosen the wax. Then, a syringe containing water and a sterile salt solution (saline) can be gently inserted into the ear canal. The saline is used to flush out wax and other debris. Ear irrigation kits are also available to use at home. Ask your health care provider if this is an option for you. Use a home irrigation kit only as told by your health care provider. Read the package instructions carefully. Follow the directions for using the syringe. Use water that is room temperature. Do not do ear irrigation at home if you:  Have  diabetes. Diabetes increases the risk of infection.  Have a hole or tear in your eardrum.  Have tubes in your ears.  What are the risks of ear irrigation? Generally, this is a safe procedure. However, problems may occur, including:  Infection.  Pain.  Hearing loss.  Pushing water and debris into the eardrum. This can occur if there are holes in the eardrum.  Ear irrigation failing to work.  How should I care for my ears after having them irrigated? Cleaning  Clean the outside of your ear with a soft washcloth daily.  If told by your health care provider, use a few drops of baby oil, mineral oil, glycerin, hydrogen peroxide, or over-the-counter earwax softening drops.  Do not use cotton swabs to clean your ears. These can push wax down into the ear canal.  Do not put anything into your ears to try to remove wax. This includes ear candles. General Instructions  Take over-the-counter and prescription medicines only as told by your health care provider.  If you were prescribed an antibiotic medicine, use it as told by your health care provider. Do not stop using the antibiotic even if your condition improves.  Keep all follow-up visits as told by your health care provider. This is important.  Visit your health care provider at least once a year to have your ears and hearing checked. When should I seek medical care? Seek medical care if:  Your hearing is not improving or is getting worse.  You  have pain or redness in your ear.  You have fluid, blood, or pus coming out of your ear.  This information is not intended to replace advice given to you by your health care provider. Make sure you discuss any questions you have with your health care provider. Document Released: 01/14/2015 Document Revised: 11/14/2015 Document Reviewed: 05/27/2014 Elsevier Interactive Patient Education  2017 [employer].

## 2017-01-09 ENCOUNTER — Ambulatory Visit: Payer: BC Managed Care – PPO | Admitting: Physician Assistant
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
## 2017-04-10 ENCOUNTER — Encounter: Payer: Self-pay | Admitting: Physician Assistant

## 2017-08-12 ENCOUNTER — Ambulatory Visit: Payer: Self-pay | Admitting: *Deleted

## 2017-08-12 NOTE — Telephone Encounter (Signed)
Pt states he has been experiencing blood in his stools for the past 3-4 months. Pt states he does not have blood with every stool but has it '"with every 2-3 passings of stool". Pt states that he did have one episode today in which there was a small amount of blood on the tissue. Pt states initially thought the bloody stools were related to food allergies and was being followed by a nutritionist but has continued to have bloody stools. 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 Pt denies any abdominal pain or other symptoms at this time. Pt states once he stopped using palm oil his stomach pain went away and so did his episodes of diarrhea. Pt asking for referral for rectal bleeding. Pt advised that he would need to be seen in the office for current symptoms because he has not been evaluated prior to referral.Pt offered to make appt on tomorrow with Dr. Creta LevinStallings per pt's request but pt states he has to check his schedule and will return call to the office to schedule appt.  Reason for Disposition . Rectal bleeding is a chronic symptom (recurrent or ongoing AND present > 4 weeks)  Answer Assessment - Initial Assessment Questions 1. APPEARANCE of BLOOD: "What color is it?" "Is it passed separately, on the surface of the stool, or mixed in with the stool?"      Last stool was just on the tissue, pt unable to tell the color due to being color blind 2. AMOUNT: "How much blood was passed?"      Very little today, today when he wiped 3. FREQUENCY: "How many times has blood been passed with the stools?"      for the past 3-4 months 4. ONSET: "When was the blood first seen in the stools?" (Days or weeks)      2-3 months 5. DIARRHEA: "Is there also some diarrhea?" If so, ask: "How many diarrhea stools were passed in past 24 hours?"      Had been but related it to food sensitivity, has been avoiding palm oil and diarrhea has improved with  avoiding palm oil 6. CONSTIPATION: "Do you have constipation?" If so, "How bad is it?"     Yes sometimes 7. RECURRENT SYMPTOMS: "Have you had blood in your stools before?" If so, ask: "When was the last time?" and "What happened that time?"      Yes for the past 3-4 months but was not aware until his wife told him after seeing blood in the trash 8. BLOOD THINNERS: "Do you take any blood thinners?" (e.g., Coumadin/warfarin, Pradaxa/dabigatran, aspirin)     No 9. OTHER SYMPTOMS: "Do you have any other symptoms?"  (e.g., abdominal pain, vomiting, dizziness, fever)     No, was having stomach pain with taking palm oil but stomach pain subsided with avoidance of palm oil  Protocols used: RECTAL BLEEDING-A-AH

## 2017-08-14 ENCOUNTER — Telehealth: Payer: Self-pay | Admitting: Emergency Medicine

## 2017-08-14 NOTE — Telephone Encounter (Signed)
Left a vm on pt phone to call. Pt is requesting a referral but looks like he was last seen here sept of 2018 left a message for him to call back and make an appt
